# Patient Record
Sex: Male | Born: 1965 | Race: Black or African American | Hispanic: No | Smoking: Never smoker
Health system: Southern US, Community
[De-identification: ages and names within clinical notes are randomized; demographics above are authoritative.]

## PROBLEM LIST (undated history)

## (undated) HISTORY — PX: ROTATOR CUFF REPAIR: SHX139

## (undated) HISTORY — PX: OTHER SURGICAL HISTORY: SHX169

---

## 2016-10-30 ENCOUNTER — Emergency Department (HOSPITAL_COMMUNITY): Payer: BLUE CROSS/BLUE SHIELD

## 2016-10-30 ENCOUNTER — Emergency Department (HOSPITAL_COMMUNITY)
Admission: EM | Admit: 2016-10-30 | Discharge: 2016-10-30 | Disposition: A | Discharge status: Home / Self Care | Payer: BLUE CROSS/BLUE SHIELD | Attending: Emergency Medicine | Admitting: Emergency Medicine

## 2016-10-30 ENCOUNTER — Encounter (HOSPITAL_COMMUNITY): Payer: Self-pay | Admitting: Nurse Practitioner

## 2016-10-30 DIAGNOSIS — Y93B9 Activity, other involving muscle strengthening exercises: Secondary | ICD-10-CM | POA: Insufficient documentation

## 2016-10-30 DIAGNOSIS — Y999 Unspecified external cause status: Secondary | ICD-10-CM | POA: Insufficient documentation

## 2016-10-30 DIAGNOSIS — M25562 Pain in left knee: Secondary | ICD-10-CM | POA: Diagnosis present

## 2016-10-30 DIAGNOSIS — M2392 Unspecified internal derangement of left knee: Secondary | ICD-10-CM | POA: Diagnosis not present

## 2016-10-30 DIAGNOSIS — X500XXA Overexertion from strenuous movement or load, initial encounter: Secondary | ICD-10-CM | POA: Diagnosis not present

## 2016-10-30 DIAGNOSIS — Y9289 Other specified places as the place of occurrence of the external cause: Secondary | ICD-10-CM | POA: Diagnosis not present

## 2016-10-30 MED ORDER — IBUPROFEN 200 MG PO TABS
600.0000 mg | ORAL_TABLET | Freq: Once | ORAL | Status: AC
  Administered 2016-10-30: 600 mg via ORAL
  Filled 2016-10-30: qty 3

## 2016-10-30 NOTE — ED Notes (Signed)
PT REFUSED THE KNEE IMMOBILIZER AND CRUTCHES. HE STS HE WILL JUST FOLLOW- UP WITH THE ORTHO DOCTOR.

## 2016-10-30 NOTE — Discharge Instructions (Signed)
Please read instructions below. Apply ice to your knee for 20 minutes at a time. You can take 600mg  of advil every 6 hours as needed for pain. Schedule an appointment with the orthopedic specialist within 1 week for follow-up on your injury. Wear the brace at all times until you have seen the specialist. Return to the ER for new or concerning symptoms.

## 2016-10-30 NOTE — ED Notes (Signed)
PT DISCHARGED. INSTRUCTIONS GIVEN. AAOX4. PT IN NO APPARENT DISTRESS WITH MODERATE PAIN. THE OPPORTUNITY TO ASK QUESTIONS WAS PROVIDED. 

## 2016-10-30 NOTE — ED Provider Notes (Signed)
[facility]-EMERGENCY DEPT Provider Note   CSN: [id number] Arrival date & time: 10/30/16  0746     History   Chief Complaint Chief Complaint  Patient presents with  . Knee Pain    Left    HPI David Goodwin is a 51 y.o. male.  Patient presents with acute onset left knee pain. Pt reports he was lifting weights and squatting, on the way up from squatting he felt a pop with instant pain located anterior medial aspect of his left knee. Patient states he has a history of left anterior cruciate ligament injury. Denies numbness tingling down left extremity. Reports ice gave some improvement of symptoms. States pain is 6 out of 10 at rest and worse with walking.       History reviewed. No pertinent past medical history.  There are no active problems to display for this patient.   Past Surgical History:  Procedure Laterality Date  .  chiari malformation    . ROTATOR CUFF REPAIR         Home Medications    Prior to Admission medications   Not on File    Family History History reviewed. No pertinent family history.  Social History Social History  Substance Use Topics  . Smoking status: Never Smoker  . Smokeless tobacco: Never Used  . Alcohol use No     Allergies   Patient has no known allergies.   Review of Systems Review of Systems  Musculoskeletal: Positive for arthralgias and joint swelling.  Skin: Negative for wound.  Neurological: Negative for numbness.     Physical Exam Updated Vital Signs BP 132/83   Pulse 80   Temp 98.2 F (36.8 C) (Oral)   Resp 18   Ht 6\' 5"  (1.956 m)   Wt 129.3 kg (285 lb)   SpO2 100%   BMI 33.80 kg/m   Physical Exam  Constitutional: He appears well-developed and well-nourished.  HENT:  Head: Normocephalic and atraumatic.  Eyes: Conjunctivae are normal.  Cardiovascular: Normal rate and intact distal pulses.   Pulmonary/Chest: Effort normal.  Musculoskeletal:  Left knee with mild edema. TTP over medial joint line and  superior to patella. Positive anterior drawer, with increased laxity compared to right knee. Normal Active extension of knee. Passive range of motion. Negative posterior drawer, negative valgus & varus. Sensations intact. Intact distal pulses.  Neurological: No sensory deficit.  Psychiatric: He has a normal mood and affect. His behavior is normal.  Nursing note and vitals reviewed.    ED Treatments / Results  Labs (all labs ordered are listed, but only abnormal results are displayed) Labs Reviewed - No data to display  EKG  EKG Interpretation None       Radiology Dg Knee Complete 4 Views Left  Result Date: 10/30/2016 CLINICAL DATA:  Left knee pain and swelling after exercise this morning. EXAM: LEFT KNEE - COMPLETE 4+ VIEW COMPARISON:  None. FINDINGS: Moderate suprapatellar left knee joint effusion. No fracture or dislocation. No suspicious focal osseous lesion. 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 Minimal lateral and patellofemoral compartment osteoarthritis. Small superior left patellar enthesophyte. IMPRESSION: 1. Moderate suprapatellar left knee joint effusion. No fracture or malalignment. 2. Minimal lateral and patellofemoral compartment left knee osteoarthritis . 3. 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 Electronically Signed   By: Delbert PhenixJason A Poff M.D.   On: 10/30/2016 08:16    Procedures Procedures (including critical care time)  Medications Ordered in ED Medications  ibuprofen (ADVIL,MOTRIN) tablet 600  mg (not administered)     Initial Impression / Assessment and Plan / ED Course  I have reviewed the triage vital signs and the nursing notes.  Pertinent labs & imaging results that were available during my care of the patient were reviewed by me and considered in my medical decision making (see chart for details).     Pt w left knee injury, suspect possible internal derangement. Patient X-Ray negative for  obvious fracture or dislocation. Patient with previous anterior cruciate ligament injury, with positive anterior drawer test of left knee. NV intact. Due to the uncertainty of extent of previous ACL injury, knee placed in an immobilizer brace, crutches given, orthopedic referral for follow-up. Pain managed in ED. Pt advised to follow up with orthopedics in 1 week. Patient given brace while in ED, conservative therapy recommended and discussed. Patient will be dc home & is agreeable with above plan.  Discussed results, findings, treatment and follow up. Patient advised of return precautions. Patient verbalized understanding and agreed with plan.    Final Clinical Impressions(s) / ED Diagnoses   Final diagnoses:  Acute internal derangement of left knee    New Prescriptions New Prescriptions   No medications on file     Russo, [place] N, PA-C 10/30/16 1158    Donnetta Hutching, MD 11/01/16 1246    Donnetta Hutching, MD 11/01/16 1248

## 2016-10-30 NOTE — ED Triage Notes (Signed)
Pt brought in via EMS from home. States he was at the gym and was lifting weights. When he did squats he felt something popped and knee gave way. Now having 10/10 of left knee pain. Has a hx of Left ACL injury. Per ems neuro exam intact. VS: 130/80, 88, 18, 100% RA. Pt denies any medication use.

## 2018-08-28 IMAGING — CR DG KNEE COMPLETE 4+V*L*
5 series · 5 of 5 positions shown · non-contrast
Comparison: None.

CLINICAL DATA: Left knee pain and swelling after exercise this
morning.

EXAM:
LEFT KNEE - COMPLETE 4+ VIEW

[t knee ap left]
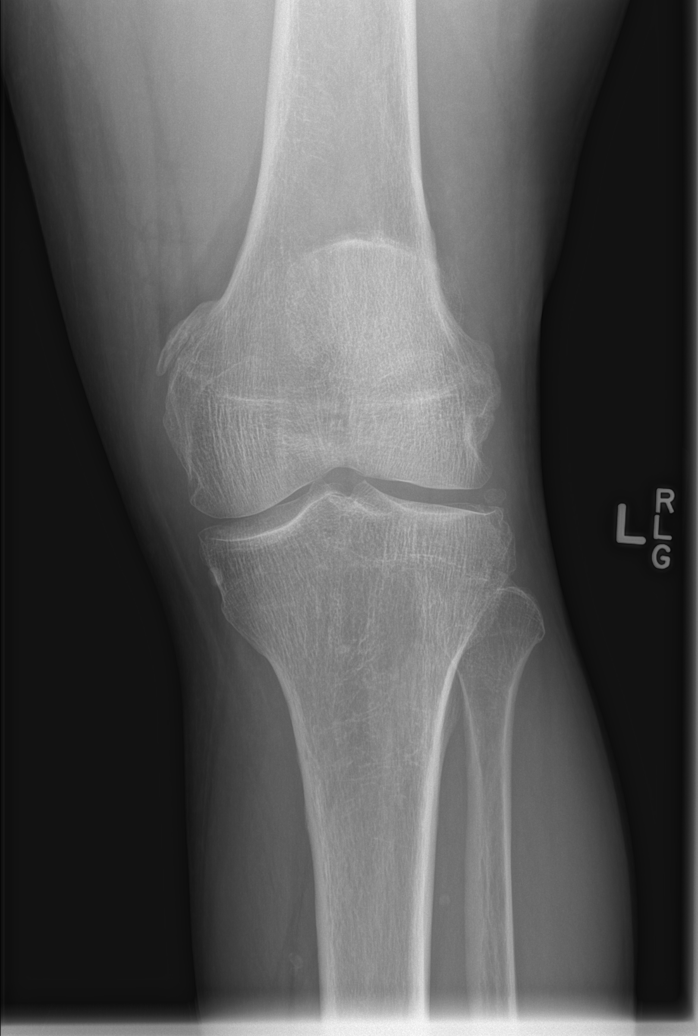

[t knee obl left (1 of 2)]
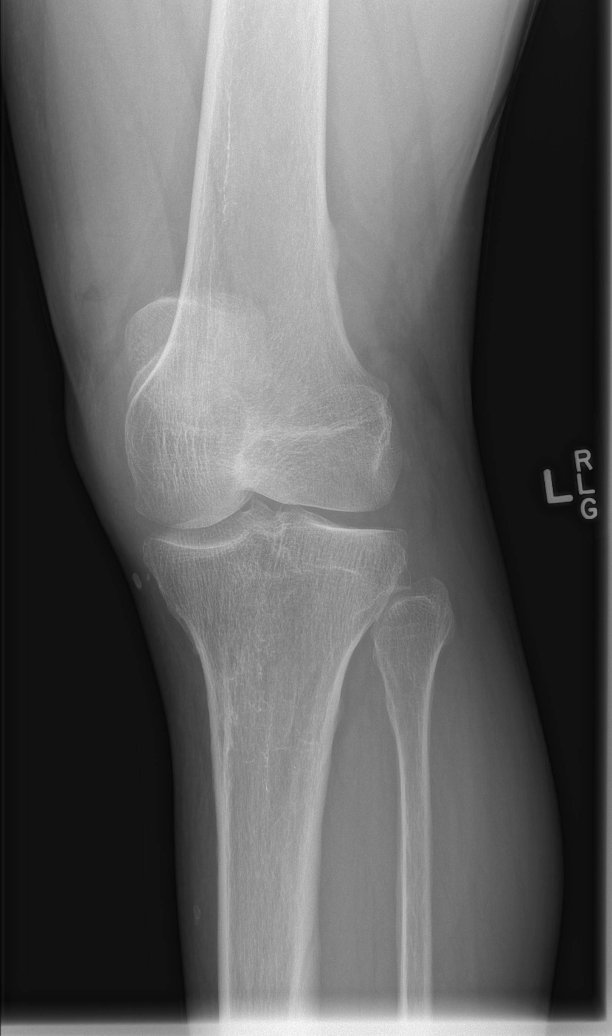

[t knee obl left (2 of 2)]
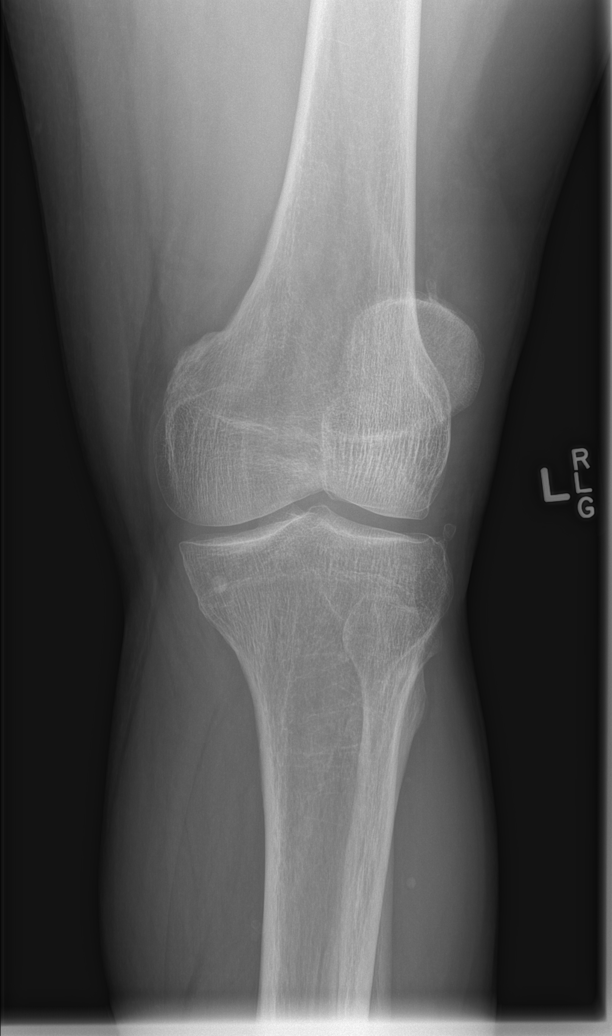

[t knee lat left (1 of 2)]
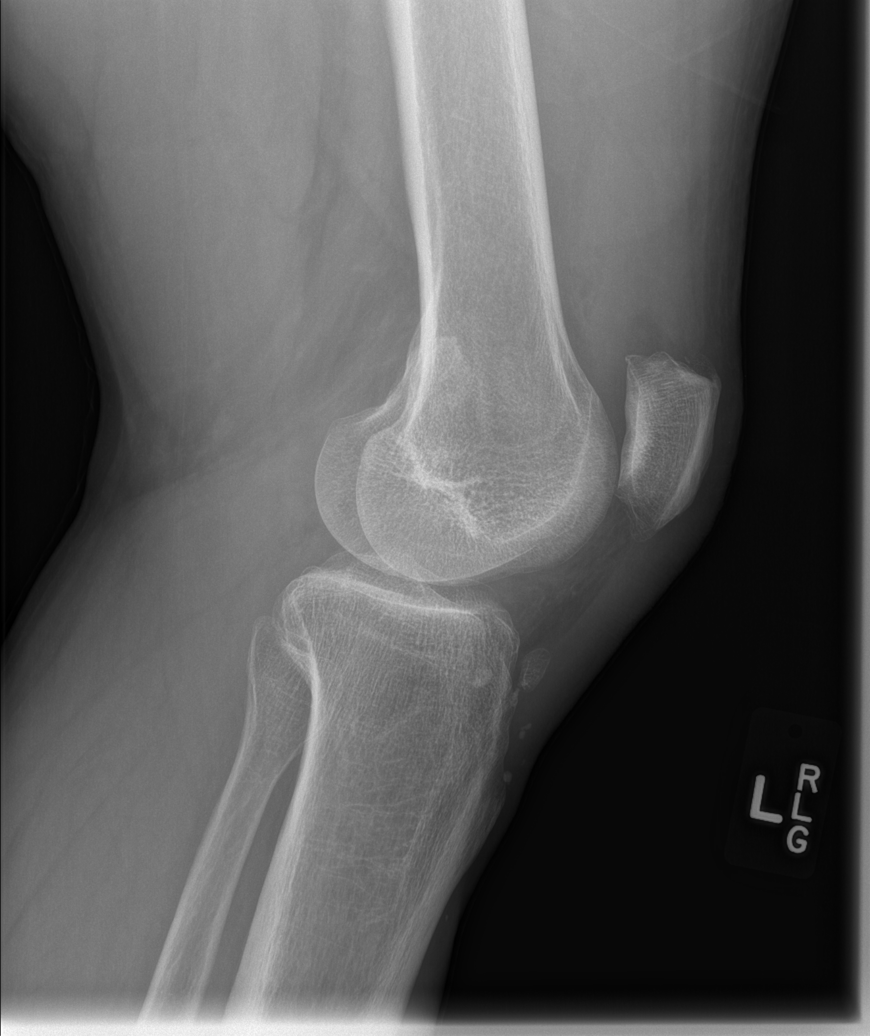

[t knee lat left (2 of 2)]
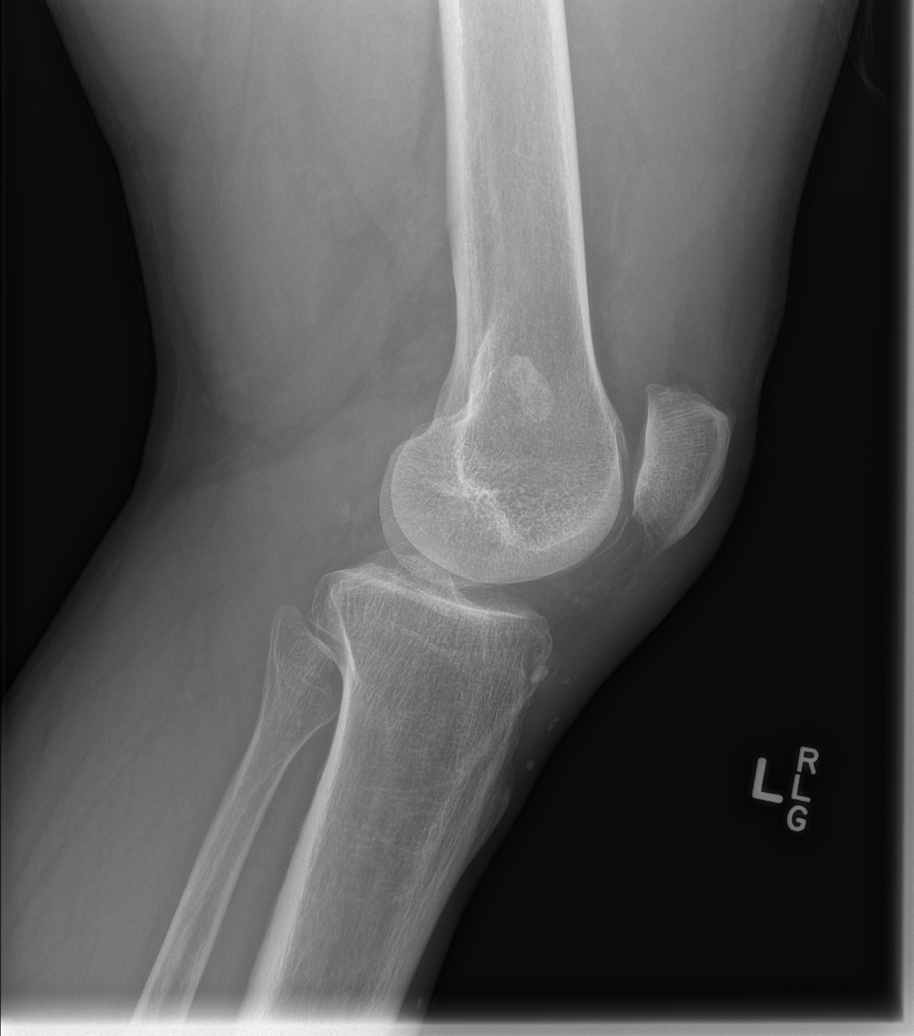

[5 of 5 positions shown; findings below may reference images not displayed]

FINDINGS: Moderate suprapatellar left knee joint effusion. No fracture or
dislocation. No suspicious focal osseous lesion. Pellegrini-Stieda
lesion at the medial left distal femoral epicondyle. Minimal lateral
and patellofemoral compartment osteoarthritis. Small superior left
patellar enthesophyte.
IMPRESSION: 1. Moderate suprapatellar left knee joint effusion. No fracture or
malalignment.
2. Minimal lateral and patellofemoral compartment left knee
osteoarthritis .
3. Pellegrini-Stieda lesion at the medial left distal femoral
epicondyle.
